# Patient Record
Sex: Female | Born: 1935 | Race: White | Smoking: Current every day smoker
Health system: Southern US, Community
[De-identification: ages and names within clinical notes are randomized; demographics above are authoritative.]

## PROBLEM LIST (undated history)

## (undated) DIAGNOSIS — E119 Type 2 diabetes mellitus without complications: Secondary | ICD-10-CM

## (undated) DIAGNOSIS — I10 Essential (primary) hypertension: Secondary | ICD-10-CM

## (undated) DIAGNOSIS — J449 Chronic obstructive pulmonary disease, unspecified: Secondary | ICD-10-CM

---

## 2012-03-06 ENCOUNTER — Ambulatory Visit: Payer: Self-pay | Admitting: General Practice

## 2012-03-06 DIAGNOSIS — I10 Essential (primary) hypertension: Secondary | ICD-10-CM

## 2012-03-06 LAB — BASIC METABOLIC PANEL
Anion Gap: 9 (ref 7–16)
BUN: 17 mg/dL (ref 7–18)
Calcium, Total: 9.2 mg/dL (ref 8.5–10.1)
Creatinine: 0.83 mg/dL (ref 0.60–1.30)
Osmolality: 289 (ref 275–301)
Potassium: 4.3 mmol/L (ref 3.5–5.1)

## 2012-03-06 LAB — URINALYSIS, COMPLETE
Bilirubin,UR: NEGATIVE
Glucose,UR: NEGATIVE mg/dL (ref 0–75)
Leukocyte Esterase: NEGATIVE
Protein: NEGATIVE
RBC,UR: 3 /HPF (ref 0–5)
Specific Gravity: 1.018 (ref 1.003–1.030)
Squamous Epithelial: NONE SEEN
WBC UR: 1 /HPF (ref 0–5)

## 2012-03-06 LAB — CBC
HCT: 41.1 % (ref 35.0–47.0)
HGB: 13.3 g/dL (ref 12.0–16.0)
MCH: 27.8 pg (ref 26.0–34.0)
MCHC: 32.4 g/dL (ref 32.0–36.0)
Platelet: 173 10*3/uL (ref 150–440)
RDW: 13.8 % (ref 11.5–14.5)

## 2012-03-06 LAB — SEDIMENTATION RATE: Erythrocyte Sed Rate: 4 mm/hr (ref 0–30)

## 2012-03-06 LAB — APTT: Activated PTT: 30.3 secs (ref 23.6–35.9)

## 2012-03-06 LAB — MRSA PCR SCREENING

## 2012-03-06 LAB — PROTIME-INR
INR: 0.9
Prothrombin Time: 12 s

## 2012-03-08 LAB — URINE CULTURE

## 2012-03-21 ENCOUNTER — Inpatient Hospital Stay: Payer: Self-pay | Admitting: General Practice

## 2012-03-22 LAB — BASIC METABOLIC PANEL
BUN: 12 mg/dL (ref 7–18)
Chloride: 106 mmol/L (ref 98–107)
Creatinine: 0.96 mg/dL (ref 0.60–1.30)
EGFR (African American): >60
Potassium: 4 mmol/L (ref 3.5–5.1)
Sodium: 140 mmol/L (ref 136–145)

## 2012-03-23 LAB — BASIC METABOLIC PANEL
Anion Gap: 9 (ref 7–16)
BUN: 12 mg/dL (ref 7–18)
Calcium, Total: 8.7 mg/dL (ref 8.5–10.1)
Chloride: 110 mmol/L — ABNORMAL HIGH (ref 98–107)
Co2: 24 mmol/L (ref 21–32)
Creatinine: 0.89 mg/dL (ref 0.60–1.30)
Glucose: 97 mg/dL (ref 65–99)
Potassium: 4.1 mmol/L (ref 3.5–5.1)
Sodium: 143 mmol/L (ref 136–145)

## 2012-03-23 LAB — PLATELET COUNT: Platelet: 124 x10 3/mm 3 — ABNORMAL LOW

## 2012-03-23 LAB — HEMOGLOBIN: HGB: 10.6 g/dL — ABNORMAL LOW

## 2012-03-25 ENCOUNTER — Encounter: Payer: Self-pay | Admitting: Internal Medicine

## 2012-09-28 IMAGING — CR DG KNEE 1-2V*L*
1 series · 2 of 2 positions shown · non-contrast
Comparison: none

REASON FOR EXAM: postop
COMMENTS:   Bedside (portable):Y

PROCEDURE:     DXR - DXR KNEE LEFT AP AND LATERAL  - March 21, 2012 [DATE]
RESULT:     History: Post-op TKR

[Series 1: ap · 0.17mm/px · 2 of 2 slices shown]
[im 1/2]
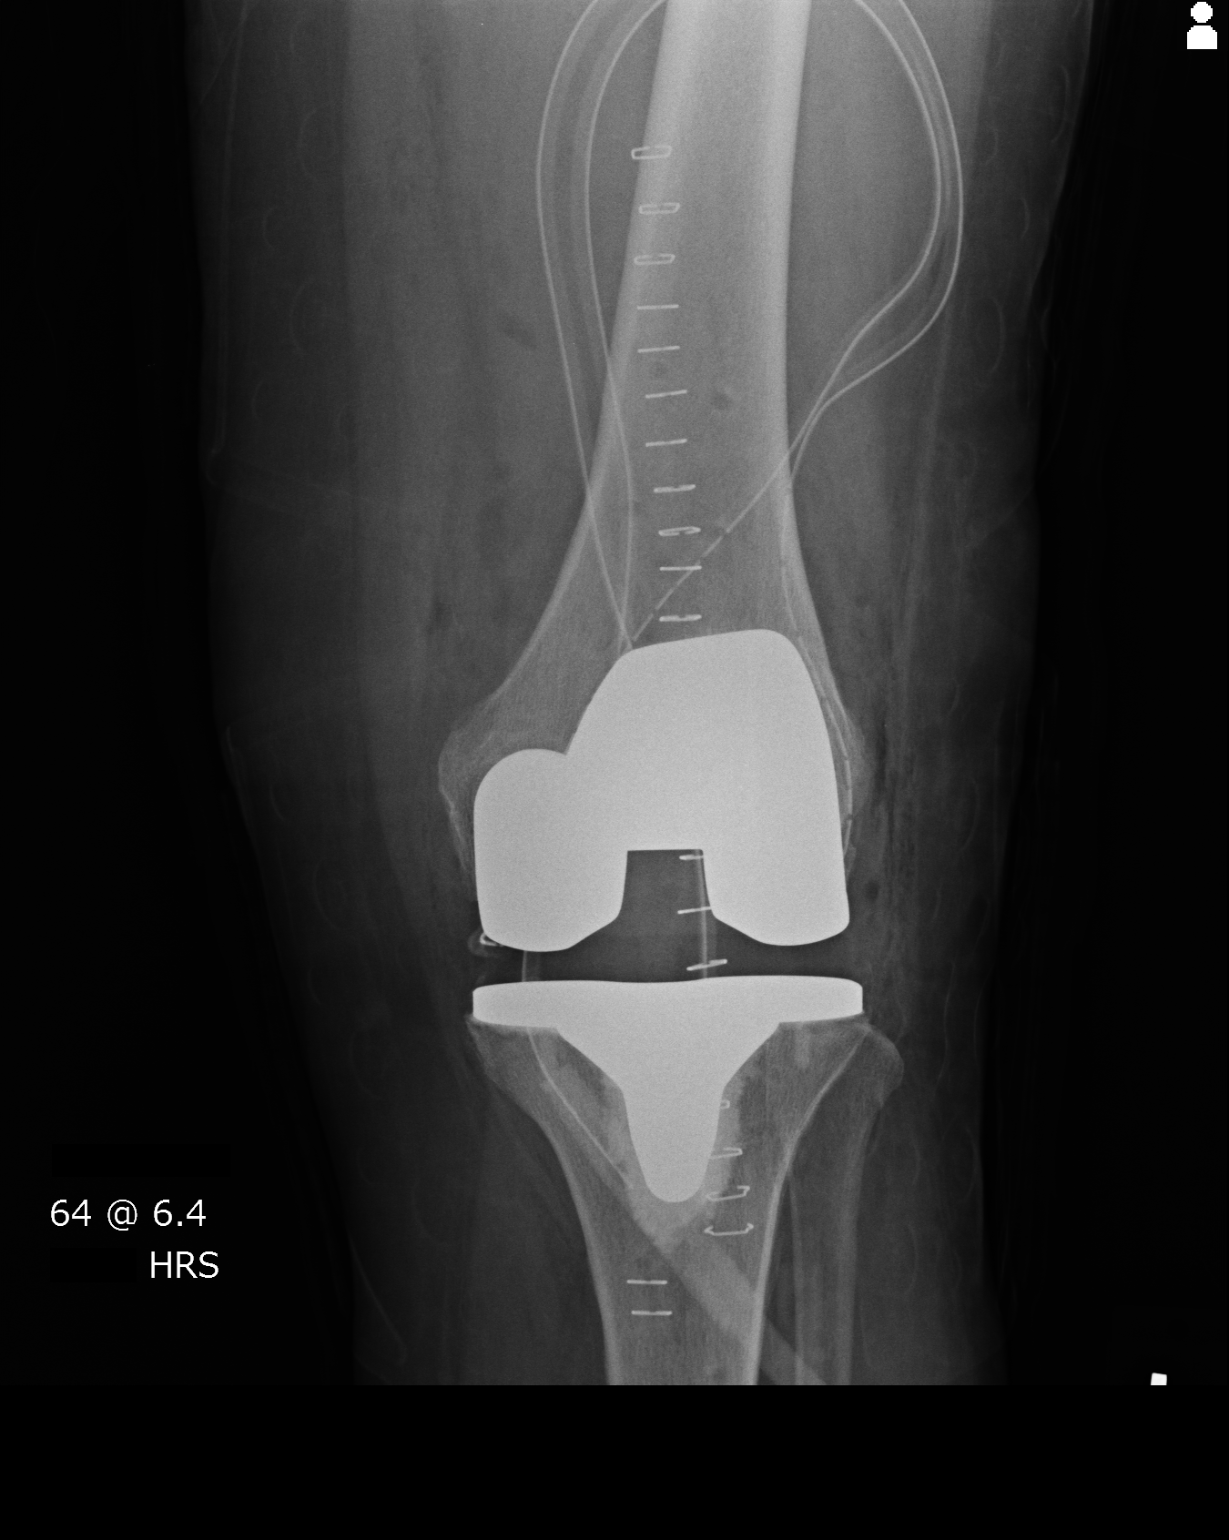
[im 2/2]
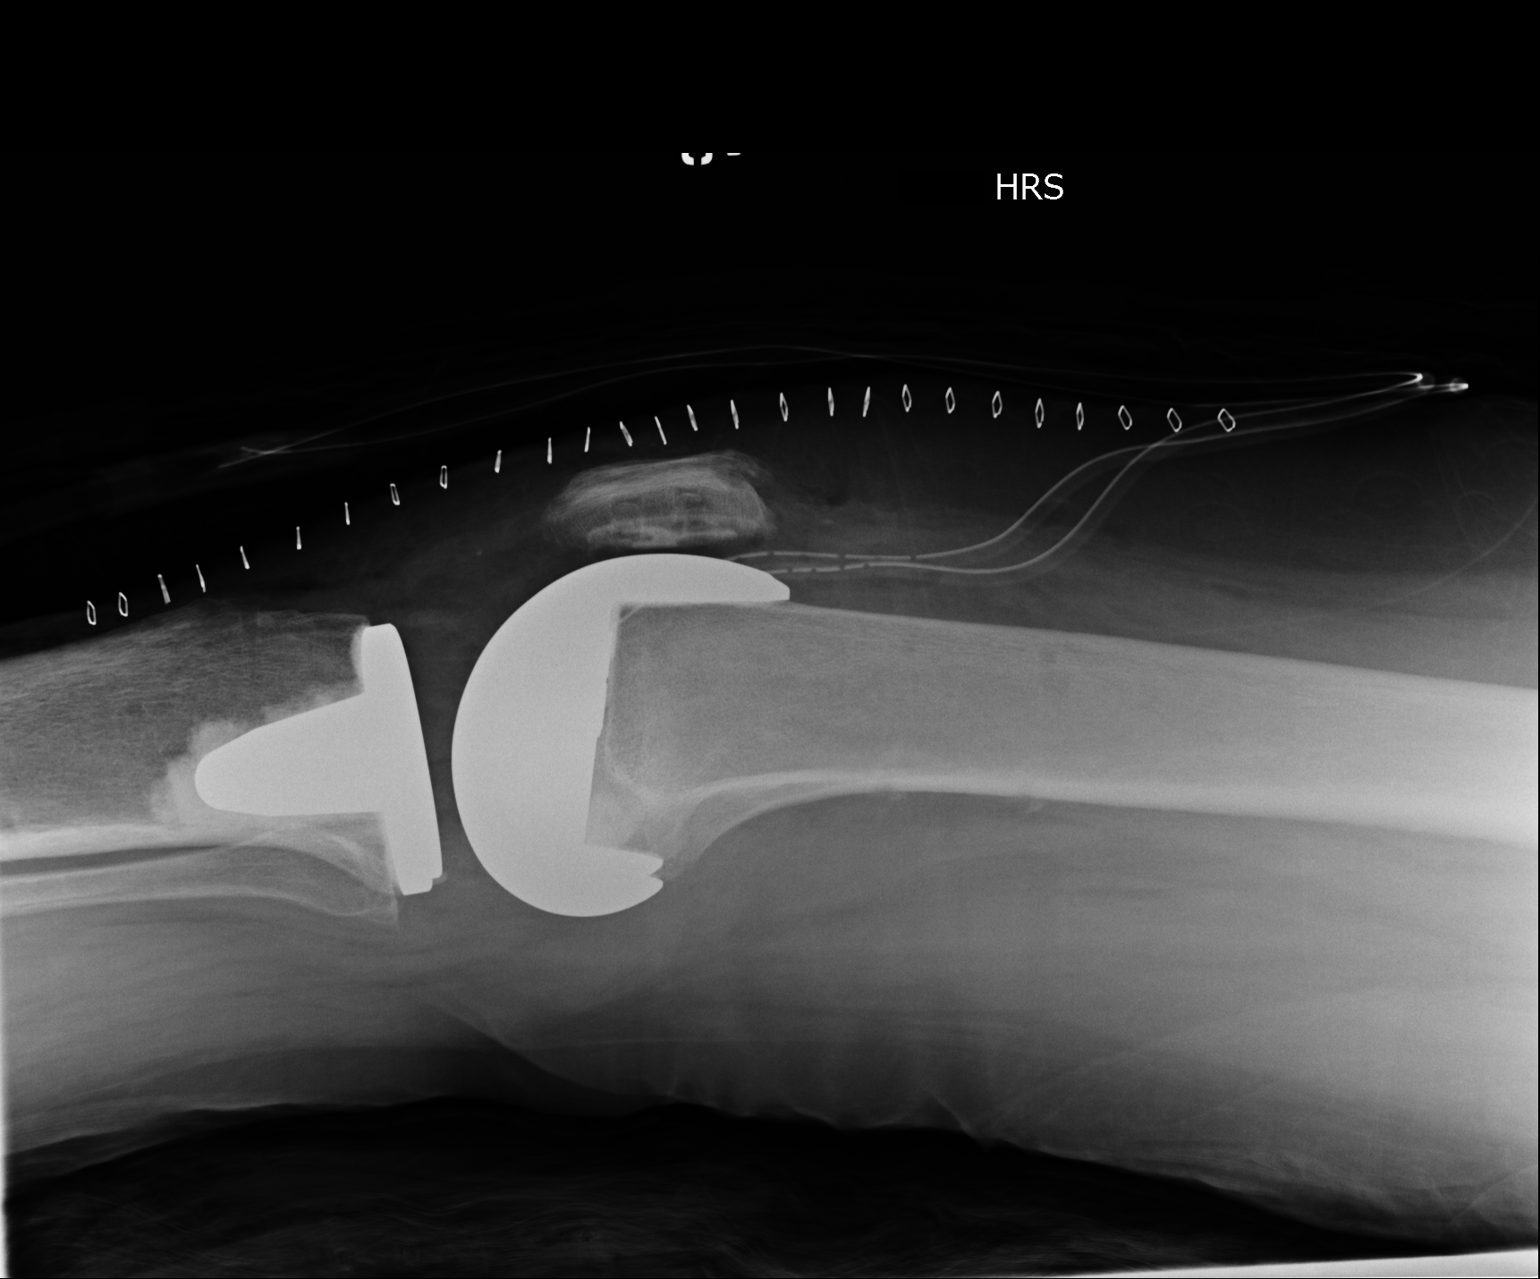

[2 of 2 positions shown; findings below may reference images not displayed]

FINDINGS: AP and lateral views of the left knee demonstrates a total left knee
arthroplasty without evidence of hardware failure complication. There is no
significant joint effusion. There is no fracture or dislocation. The
alignment is anatomic.
IMPRESSION: Left total knee arthroplasty.

[REDACTED]

## 2015-02-08 NOTE — Op Note (Signed)
PATIENT NAME:  Kirsten Petersen, Kirsten Petersen MR#:  [id number] DATE OF BIRTH:  Dec 26, 1922  DATE OF PROCEDURE:  03/21/2012  PREOPERATIVE DIAGNOSIS: Degenerative arthrosis of the left knee.   POSTOPERATIVE DIAGNOSIS: Degenerative arthrosis of the left knee.   PROCEDURE PERFORMED: Left total knee arthroplasty using computer-assisted navigation.   SURGEON: Illene LabradorJames P. Angie FavaHooten Jr., MD  ASSISTANT: Van ClinesJon Wolfe, PA-C (required to maintain retraction throughout the procedure)   ANESTHESIA: Femoral nerve block and spinal.   ESTIMATED BLOOD LOSS: 100 mL.   FLUIDS REPLACED: 1100 mL of crystalloid.   TOURNIQUET TIME: 81 minutes.   DRAINS: Two medium drains to reinfusion system.   SOFT TISSUE RELEASES: Anterior cruciate ligament, posterior cruciate ligament, deep medial collateral ligament, patellofemoral ligament.   IMPLANTS UTILIZED: DePuy PFC Sigma size 3 posterior stabilized femoral component (cemented), size 3 MBT tibial component (cemented), 32 mm three peg oval dome patella (cemented), and a 10 mm stabilized rotating platform polyethylene insert.   INDICATIONS FOR SURGERY: The patient is an 81 year old female who has been seen for complaints of progressive left knee pain. X-rays demonstrated significant degenerative changes in tricompartmental fashion. After discussion of the risks and benefits of surgical intervention, the patient expressed her understanding of the risks and benefits and agreed with plans for surgical intervention.   PROCEDURE IN DETAIL: Patient was brought into the Operating Room and, after adequate femoral nerve block and spinal anesthesia was achieved, a tourniquet was placed on the patient's upper left thigh. Patient's left knee and leg were cleaned and prepped with alcohol and DuraPrep, draped in the usual sterile fashion. A "timeout" was performed as per usual protocol. The left lower extremity was exsanguinated using an Esmarch, and the tourniquet was inflated to 300 mmHg. Anterior  longitudinal incision was made followed by a standard mid vastus approach. A large effusion was evacuated. The deep fibers of the medial collateral ligament were elevated in subperiosteal fashion off the medial flare of the tibia so as to maintain a continuous soft tissue sleeve. Patella was subluxed laterally and the patellofemoral ligament was incised. Inspection of the knee demonstrated severe degenerative changes in tricompartmental fashion. Prominent osteophytes were debrided using rongeur. Anterior and posterior cruciate ligaments were excised. Two 4.0 mm Schanz pins were inserted into the femur and into the tibia for attachment of the array of trackers used for computer-assisted navigation. Hip center was identified using circumduction technique. Distal landmarks were mapped using computer. Distal femur and proximal tibia were mapped using computer. Distal femoral cutting guide was positioned using computer-assisted navigation so as to achieve a 5 degrees distal valgus cut. Cut was performed and verified using the computer. Distal femur was sized and it was felt that a size 3 femoral component was appropriate. Size 3 cutting guide was positioned and the anterior cut was performed and verified using the computer. This was followed by completion of the posterior and chamfer cuts. Femoral cutting guide for the central box was then positioned and central box cut was performed.   Attention was then directed to the proximal tibia. Medial and lateral menisci were excised. The extramedullary tibial cutting guide was positioned using computer-assisted navigation so as to achieve 0 degrees varus valgus alignment and 0 degrees posterior slope. Cut was performed and verified using the computer. Proximal tibia was sized and it was felt that a size 3 tibial tray was appropriate. Tibial and femoral trials were positioned and 10 mm polyethylene trial was inserted. Good medial and lateral soft tissue balancing was noted  both in full  extension and in 90 degrees of flexion. Finally, patella was cut and prepared so as to accommodate a 32 mm three peg oval dome patella. Patellar trial was placed and the knee was placed through a range of motion with excellent patellar tracking appreciated.   Femoral trial was removed. Central post hole for the tibial component was reamed followed by insertion of a keel punch. Tibial trial was then removed. Cut surfaces of bone were irrigated with copious amounts of normal saline with antibiotic solution using pulsatile lavage and then suctioned dry. Polymethyl methacrylate cement was prepared in the usual fashion using a vacuum mixer. Cement was applied to the cut surface of the proximal tibia as well as along the undersurface of a size 3 MBT tibial component. Tibial tunnel was positioned and impacted into place. Excess cement was removed using freer elevators. Cement was then applied to the cut surface of the femur as well as along the posterior flanges of a size 3 posterior stabilized femoral component. Femoral component was positioned and impacted into place. Excess cement was removed using freer elevators. A 10 mm polyethylene trial was inserted and the knee was brought into full extension with steady axial compression applied. Finally, cement was applied to the backside of a 32 mm three peg oval dome patella and patellar component was positioned and patellar clamp applied. Excess cement was removed using freer elevators.   After adequate curing of cement, tourniquet was deflated after total tourniquet time of 81 minutes. Hemostasis was achieved using electrocautery. The knee was irrigated with copious amounts of normal saline with antibiotic solution using pulsatile lavage and then suctioned dry. Knee was inspected for any residual cement debris. 30 mL of 0.25% Marcaine with epinephrine and 4 mg of morphine was injected along the posterior capsule. A 10 mm stabilized rotating platform  polyethylene insert was inserted and the knee was placed through a range of motion. Excellent mediolateral soft tissue balancing was appreciated both in full extension and in 90 degrees of flexion. Excellent patellar tracking was appreciated.   Two medium drains were placed in the wound bed and brought out through a separate stab incision to be attached to reinfusion system. The medial parapatellar portion of the incision was reapproximated using interrupted sutures of #1 Vicryl. Subcutaneous tissue was approximated in layers using first #0 Vicryl followed 2-0 Vicryl. Skin was closed with skin staples. A sterile dressing was applied.   Patient tolerated procedure well. She was transported to the recovery room in stable condition.   ____________________________ Illene Labrador. Angie Fava., MD jph:cms D: 03/21/2012 22:51:30 ET T: 03/22/2012 09:26:52 ET JOB#: [id number]  cc: Illene Labrador. Angie Fava., MD, <Dictator>  Illene Labrador Angie Fava MD ELECTRONICALLY SIGNED 03/22/2012 19:35

## 2015-02-08 NOTE — Discharge Summary (Signed)
PATIENT NAME:  Claybon JabsOOLE, Janyra MR#:  [id number] DATE OF BIRTH:  Sep 19, 1923  DATE OF ADMISSION:  03/21/2012 DATE OF DISCHARGE:  03/24/2012  ADMITTING DIAGNOSIS: Degenerative arthrosis of the left knee.   DISCHARGE DIAGNOSIS: Degenerative arthrosis of the left knee.   HISTORY: The patient is an 81 year old pleasant female who [facility] of [facility] knee pain. She reported almost a one-year history of progressive left knee pain. She had seen some swelling as well as some night pain with the knee. Her pain was noted be aggravated with weight-bearing activities and significantly affected her with stair ambulation. At the time of surgery, she was using a cane for ambulation. She states that the pain had increased in intensity to the point that it was relatively constant. She had not seen any improvement in her condition despite the use of oral and topical antiinflammatories as well as intraarticular cortisone injections as well as a series of Synvisc injections. The pain had progressed to the point that it was significantly interfering with her activities of daily living. [facility] showed narrowing of the cartilage space with subchondral sclerosis as well as osteophyte formation. After discussion of the risks and benefits of surgical intervention, the patient expressed her understanding of the risks and benefits and agreed with plans for surgical intervention.   PROCEDURE: Left total knee arthroplasty using computer-assisted navigation.   ANESTHESIA: Femoral nerve block with spinal.   SOFT TISSUE RELEASE: Anterior cruciate ligament, posterior cruciate ligament, deep medial collateral ligament, as well as the patellofemoral ligament.   IMPLANTS UTILIZED: DePuy PFC Sigma size 3 posterior stabilized femoral component (cemented), size 3 MBT tibial component (cemented), 32 mm three pegged oval dome patella (cemented), and a 10 mm stabilized  rotating platform polyethylene insert.   HOSPITAL COURSE: The patient tolerated the procedure very well. She had no complications. She was then taken to the PAC-U where she was stabilized and then transferred to the orthopedic floor. The patient began receiving anticoagulation therapy of Lovenox 30 mg subcutaneous every 12 hours per anesthesia and pharmacy protocol. She was fitted with TED stockings bilaterally. These were allowed to be removed one hour per eight hour shift. The left one was applied on day two following removal of the Hemovac and dressing change. She was also fitted with the AV-I compression foot pumps set at 80 mmHg. Her calves have been nontender. There has been no evidence of any deep venous thromboses. Negative Homans sign. Heels were elevated off the bed using rolled towels. She has voiced no complaints.   The patient has denied any chest pain or shortness of breath. Vital signs have been stable. She has been afebrile. Hemodynamically she was stable and no transfusions were given.   Physical therapy was initiated on day one for gait training and transfers. She has done well. Upon being discharged she was ambulating 120 feet. She had full extension with flexion being 79 degrees. Occupational therapy was also initiated on day one for activities of daily living and assistive devices.   The patient's IV, Hemovac, and Foley were all discontinued on day two along with a dressing change. The wound was free of any drainage or signs of infection. Polar Care was reapplied to the surgical leg maintaining a temperature of 40 to 50 degrees Fahrenheit.   DISPOSITION: The patient is being discharged to a skilled nursing facility in improved stable condition.   DISCHARGE INSTRUCTIONS:  1. She may weight bear as tolerated.  Continue with physical therapy for gait training and transfers, occupational therapy for activities of daily living and assistive devices.  2. TED stockings are to be worn  around-the-clock. These are allowed to be removed one hour per eight hour shift.  3. Incentive spirometer every 1 hour while awake. Encourage cough and deep breathing every two hours while awake.  4. Polar Care to the surgical leg maintaining a temperature of 40 to 50 degrees Fahrenheit.  5. She is placed on a regular diet.  6. She has a follow-up appointment with Van Clines on 04/05/2012 at 9:45 and Dr. Ernest Pine on 05/03/2012. She is to call the clinic sooner if any temperatures of 101.5 or greater or excessive bleeding.  7. Elevate heels off the bed.   DRUG ALLERGIES: No known drug allergies.    MEDICATIONS:  1. Tylenol ES 500 to 1000 mg every 4 to 6 hours p.r.n. for pain.  2. Celebrex 200 mg b.i.d. 3. Roxicodone 5 to 10 mg every four hours p.r.n. for pain. 4. Tramadol 50 to 100 mg every four hours p.r.n. for pain.  5. Dulcolax suppositories 10 mg rectally daily p.r.n. for constipation. 6. Milk of Magnesia 30 mL b.i.d. p.r.n.  7. Enema soapsuds if no results with milk of magnesia or Dulcolax.  8. Mylanta DS 30 mL every six hours p.r.n.  9. Pantoprazole 40 mg b.i.d.  10. Senokot-S 1 tablet b.i.d. 11. Vasotec 10 mg daily.  12. Omega-3 fatty acid 1 gram capsule daily.  13. Lovenox 30 mg subcutaneous every 12 hours for 14 days then discontinue and begin taking one 81 mg enteric-coated aspirin.   PAST MEDICAL HISTORY:  1. Hepatitis. 2. Breast lumps.  3. Arthritis.  4. Diabetes. 5. Hypertension.  ____________________________ Van Clines, PA jrw:slb D: 03/24/2012 08:55:57 ET T: 03/24/2012 09:39:27 ET JOB#: [id number]  cc: Van Clines, PA, <Dictator> JON WOLFE PA ELECTRONICALLY SIGNED 03/24/2012 21:34

## 2017-06-05 ENCOUNTER — Emergency Department
Admission: EM | Admit: 2017-06-05 | Discharge: 2017-06-05 | Disposition: A | Discharge status: Home / Self Care | Payer: Medicare Other | Attending: Emergency Medicine | Admitting: Emergency Medicine

## 2017-06-05 ENCOUNTER — Emergency Department: Payer: Medicare Other

## 2017-06-05 DIAGNOSIS — J449 Chronic obstructive pulmonary disease, unspecified: Secondary | ICD-10-CM | POA: Diagnosis not present

## 2017-06-05 DIAGNOSIS — R42 Dizziness and giddiness: Secondary | ICD-10-CM | POA: Diagnosis present

## 2017-06-05 DIAGNOSIS — R531 Weakness: Secondary | ICD-10-CM

## 2017-06-05 DIAGNOSIS — I10 Essential (primary) hypertension: Secondary | ICD-10-CM | POA: Diagnosis not present

## 2017-06-05 DIAGNOSIS — E119 Type 2 diabetes mellitus without complications: Secondary | ICD-10-CM | POA: Insufficient documentation

## 2017-06-05 HISTORY — DX: Type 2 diabetes mellitus without complications: E11.9

## 2017-06-05 HISTORY — DX: Essential (primary) hypertension: I10

## 2017-06-05 HISTORY — DX: Chronic obstructive pulmonary disease, unspecified: J44.9

## 2017-06-05 LAB — URINALYSIS, COMPLETE (UACMP) WITH MICROSCOPIC
BILIRUBIN URINE: NEGATIVE
GLUCOSE, UA: NEGATIVE mg/dL
Ketones, ur: NEGATIVE mg/dL
Leukocytes, UA: NEGATIVE
NITRITE: POSITIVE — AB
Protein, ur: NEGATIVE mg/dL
SPECIFIC GRAVITY, URINE: 1.015 (ref 1.005–1.030)
pH: 5 (ref 5.0–8.0)

## 2017-06-05 LAB — CBC
HEMATOCRIT: 40.8 % (ref 35.0–47.0)
Hemoglobin: 13.4 g/dL (ref 12.0–16.0)
MCH: 27.5 pg (ref 26.0–34.0)
MCHC: 32.9 g/dL (ref 32.0–36.0)
MCV: 83.7 fL (ref 80.0–100.0)
PLATELETS: 175 10*3/uL (ref 150–440)
RBC: 4.87 MIL/uL (ref 3.80–5.20)
RDW: 13.5 % (ref 11.5–14.5)
WBC: 6.7 10*3/uL (ref 3.6–11.0)

## 2017-06-05 LAB — BASIC METABOLIC PANEL
Anion gap: 6 (ref 5–15)
BUN: 23 mg/dL — AB (ref 6–20)
CHLORIDE: 108 mmol/L (ref 101–111)
CO2: 28 mmol/L (ref 22–32)
Calcium: 10.2 mg/dL (ref 8.9–10.3)
Creatinine, Ser: 1.06 mg/dL — ABNORMAL HIGH (ref 0.44–1.00)
GFR, EST AFRICAN AMERICAN: 51 mL/min — AB (ref >60)
GFR, EST NON AFRICAN AMERICAN: 44 mL/min — AB (ref >60)
Glucose, Bld: 89 mg/dL (ref 65–99)
POTASSIUM: 4.8 mmol/L (ref 3.5–5.1)
SODIUM: 142 mmol/L (ref 135–145)

## 2017-06-05 LAB — TROPONIN I
Troponin I: <0.03 ng/mL (ref <0.03)
Troponin I: <0.03 ng/mL (ref <0.03)

## 2017-06-05 NOTE — ED Triage Notes (Signed)
FIRST NRUSE NOTE-pt c/o chest pain for 3 weeks. Pulled for ekg

## 2017-06-05 NOTE — ED Provider Notes (Signed)
[facility] Emergency Department Provider Note       Time seen: ----------------------------------------- 12:52 PM on 06/05/2017 -----------------------------------------     I have reviewed the triage vital signs and the nursing notes.   HISTORY   Chief Complaint Chest Pain    HPI Kirsten Petersen is a 83 y.o. female who presents to the ED for not feeling well. Patient states she has dizziness when the filling occurs. She is not really described chest pain but states her chest does not feel right. She denies chest pain shortness breath at this time. She has had intermittent feelings like this for the past 3 weeks. She went to [facility] today and was sent here for further evaluation.   Past Medical History:  Diagnosis Date  . COPD (chronic obstructive pulmonary disease) (HCC)   . Diabetes mellitus without complication (HCC)   . Hypertension     There are no active problems to display for this patient.   History reviewed. No pertinent surgical history.  Allergies Patient has no known allergies.  Social History Social History  Substance Use Topics  . Smoking status: Current Every Day Smoker  . Smokeless tobacco: Not on file  . Alcohol use No    Review of Systems Constitutional: Negative for fever. Eyes: Negative for vision changes ENT:  Negative for congestion, sore throat Cardiovascular: Negative for chest pain. Respiratory: Negative for shortness of breath. Gastrointestinal: Negative for abdominal pain, vomiting and diarrhea. Genitourinary: Negative for dysuria. Musculoskeletal: Negative for back pain. Skin: Negative for rash. Neurological: Negative for headaches,Positive for generalized weakness  All systems negative/normal/unremarkable except as stated in the HPI  ____________________________________________   PHYSICAL EXAM:  VITAL SIGNS: ED Triage Vitals [06/05/17 1002]  Enc Vitals Group     BP (!) 155/69     Pulse  Rate 69     Resp 18     Temp 98.1 F (36.7 C)     Temp Source Oral     SpO2 97 %     Weight 160 lb (72.6 kg)     Height 5\' 2"  (1.575 m)     Head Circumference      Peak Flow      Pain Score      Pain Loc      Pain Edu?      Excl. in GC?     Constitutional: Alert and oriented. Well appearing and in no distress. Eyes: Conjunctivae are normal. Normal extraocular movements. ENT   Head: Normocephalic and atraumatic.   Nose: No congestion/rhinnorhea.   Mouth/Throat: Mucous membranes are moist.   Neck: No stridor. Cardiovascular: Normal rate, regular rhythm. No murmurs, rubs, or gallops. Respiratory: Normal respiratory effort without tachypnea nor retractions. Breath sounds are clear and equal bilaterally. No wheezes/rales/rhonchi. Gastrointestinal: Soft and nontender. Normal bowel sounds Musculoskeletal: Nontender with normal range of motion in extremities. No lower extremity tenderness nor edema. Neurologic:  Normal speech and language. No gross focal neurologic deficits are appreciated.  Skin:  Skin is warm, dry and intact. No rash noted. Psychiatric: Mood and affect are normal. Speech and behavior are normal.  ____________________________________________  EKG: Interpreted by me. Sinus rhythm rate of 69 bpm, normal PR interval, normal QRS, normal QT, normal axis.  ____________________________________________  ED COURSE:  Pertinent labs & imaging results that were available during my care of the patient were reviewed by me and considered in my medical decision making (see chart for details). Patient presents for weakness, we will assess with labs and  imaging as indicated.   Procedures ____________________________________________   LABS (pertinent positives/negatives)  Labs Reviewed  BASIC METABOLIC PANEL - Abnormal; Notable for the following:       Result Value   BUN 23 (*)    Creatinine, Ser 1.06 (*)    GFR calc non Af Amer 44 (*)    GFR calc Af Amer 51 (*)     All other components within normal limits  URINALYSIS, COMPLETE (UACMP) WITH MICROSCOPIC - Abnormal; Notable for the following:    Color, Urine YELLOW (*)    APPearance CLEAR (*)    Hgb urine dipstick SMALL (*)    Nitrite POSITIVE (*)    Bacteria, UA RARE (*)    Squamous Epithelial / LPF 0-5 (*)    All other components within normal limits  CBC  TROPONIN I  TROPONIN I    RADIOLOGY Images were viewed by me  Chest x-ray is normal  ____________________________________________  FINAL ASSESSMENT AND PLAN  Weakness  Plan: Patient's labs and imaging were dictated above. Patient had presented for Weakness and was never really having chest pain but states she did not feel well. Repeat troponin here is normal, no specific explanation for her symptoms. She stable for outpatient follow-up.   Emily Filbert, MD   Note: This note was generated in part or whole with voice recognition software. Voice recognition is usually quite accurate but there are transcription errors that can and very often do occur. I apologize for any typographical errors that were not detected and corrected.     Emily Filbert, MD 06/05/17 [phone]

## 2017-06-05 NOTE — ED Notes (Signed)
DC inst given to patient

## 2017-06-05 NOTE — ED Triage Notes (Signed)
Pt states that her chest "doesn't feel right". States dizziness when feeling occurs. Denies chest pain or SOB at this time. Pt alert and oriented X4, active, cooperative, pt in NAD. RR even and unlabored, color WNL.

## 2017-12-13 IMAGING — CR DG CHEST 2V
1 series · 2 of 2 positions shown · non-contrast
Comparison: Report of a chest x-ray May 21, 2003

CLINICAL DATA: Recent abnormal sensation within the chest
associated with dizziness. No chest pain or shortness of breath
currently. History of COPD, current smoker, diabetes, and
hypertension.

EXAM:
CHEST  2 VIEW

[Series 1: dg chest 2 view · 0.14mm/px · 2 of 2 slices shown]
[im 1/2]
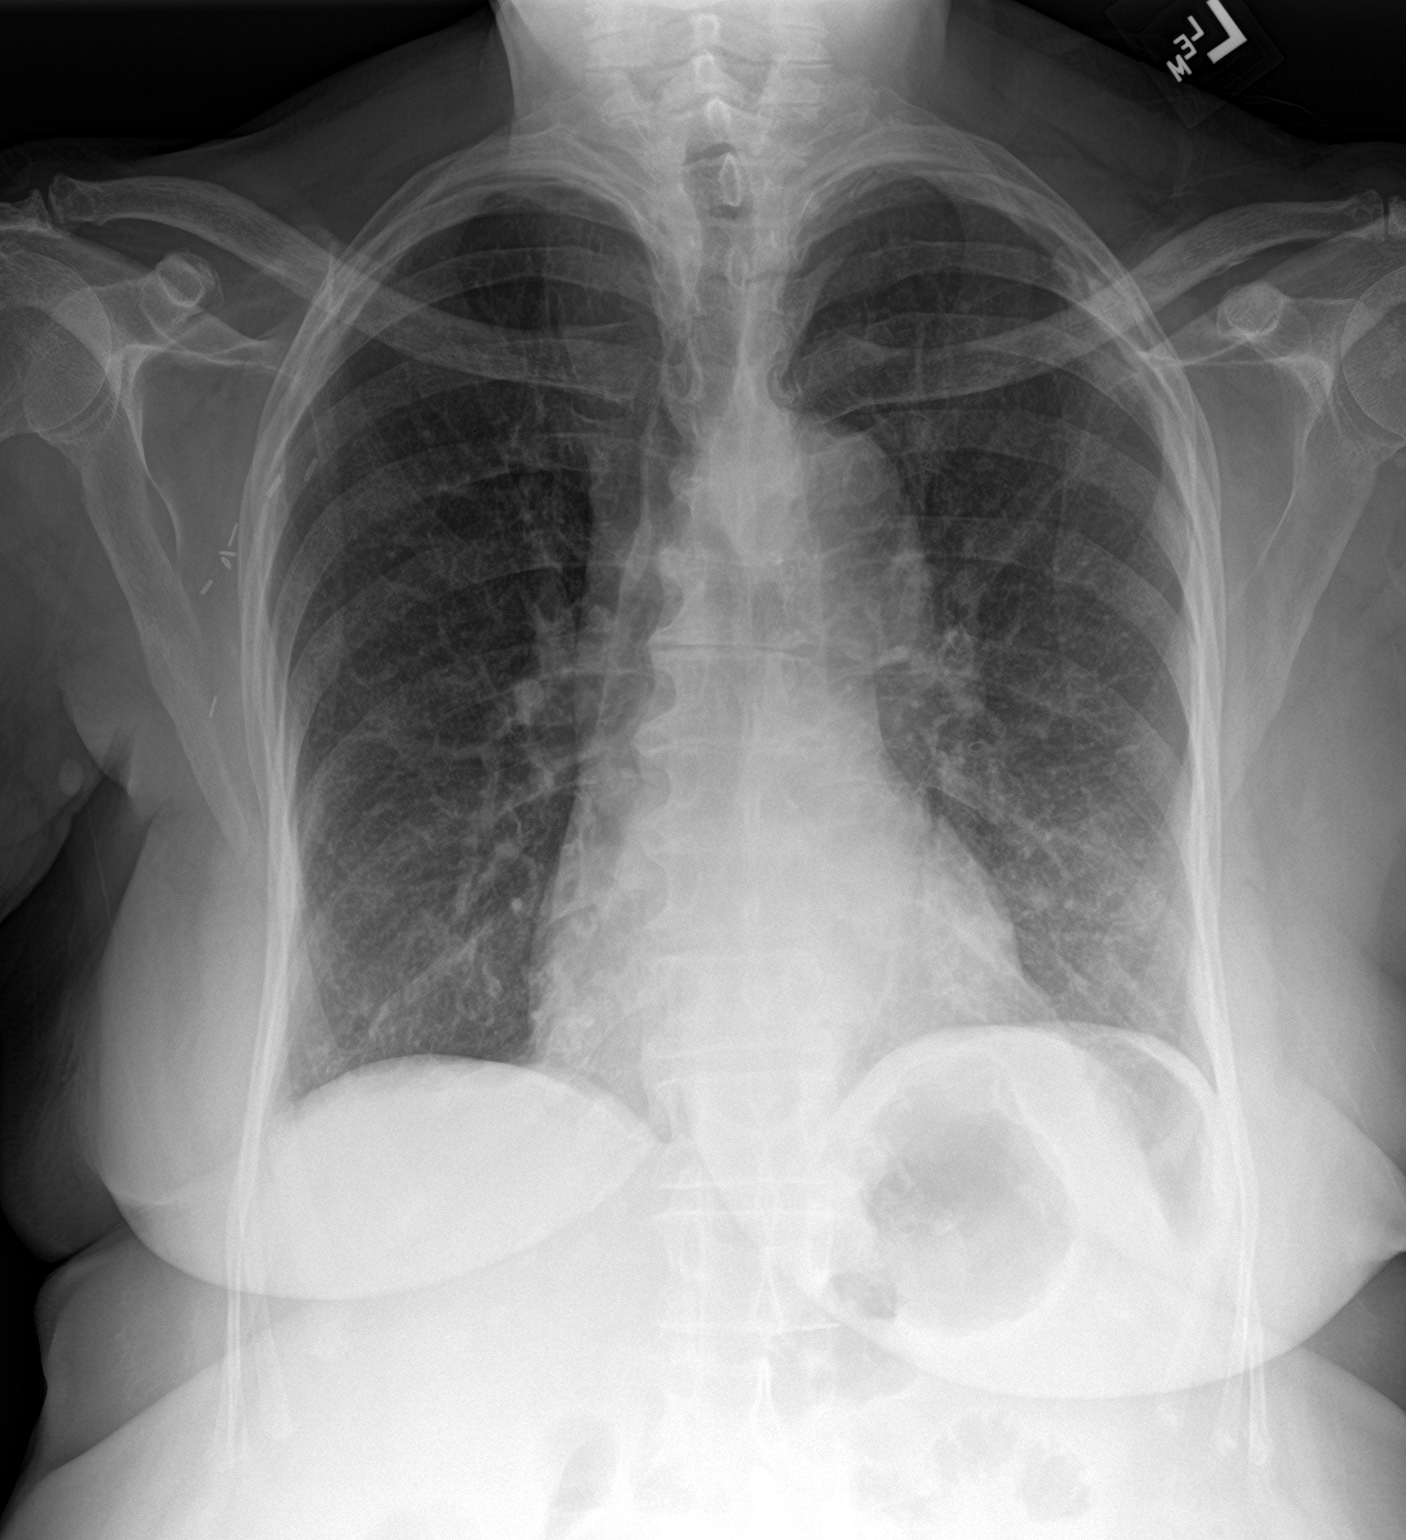
[im 2/2]
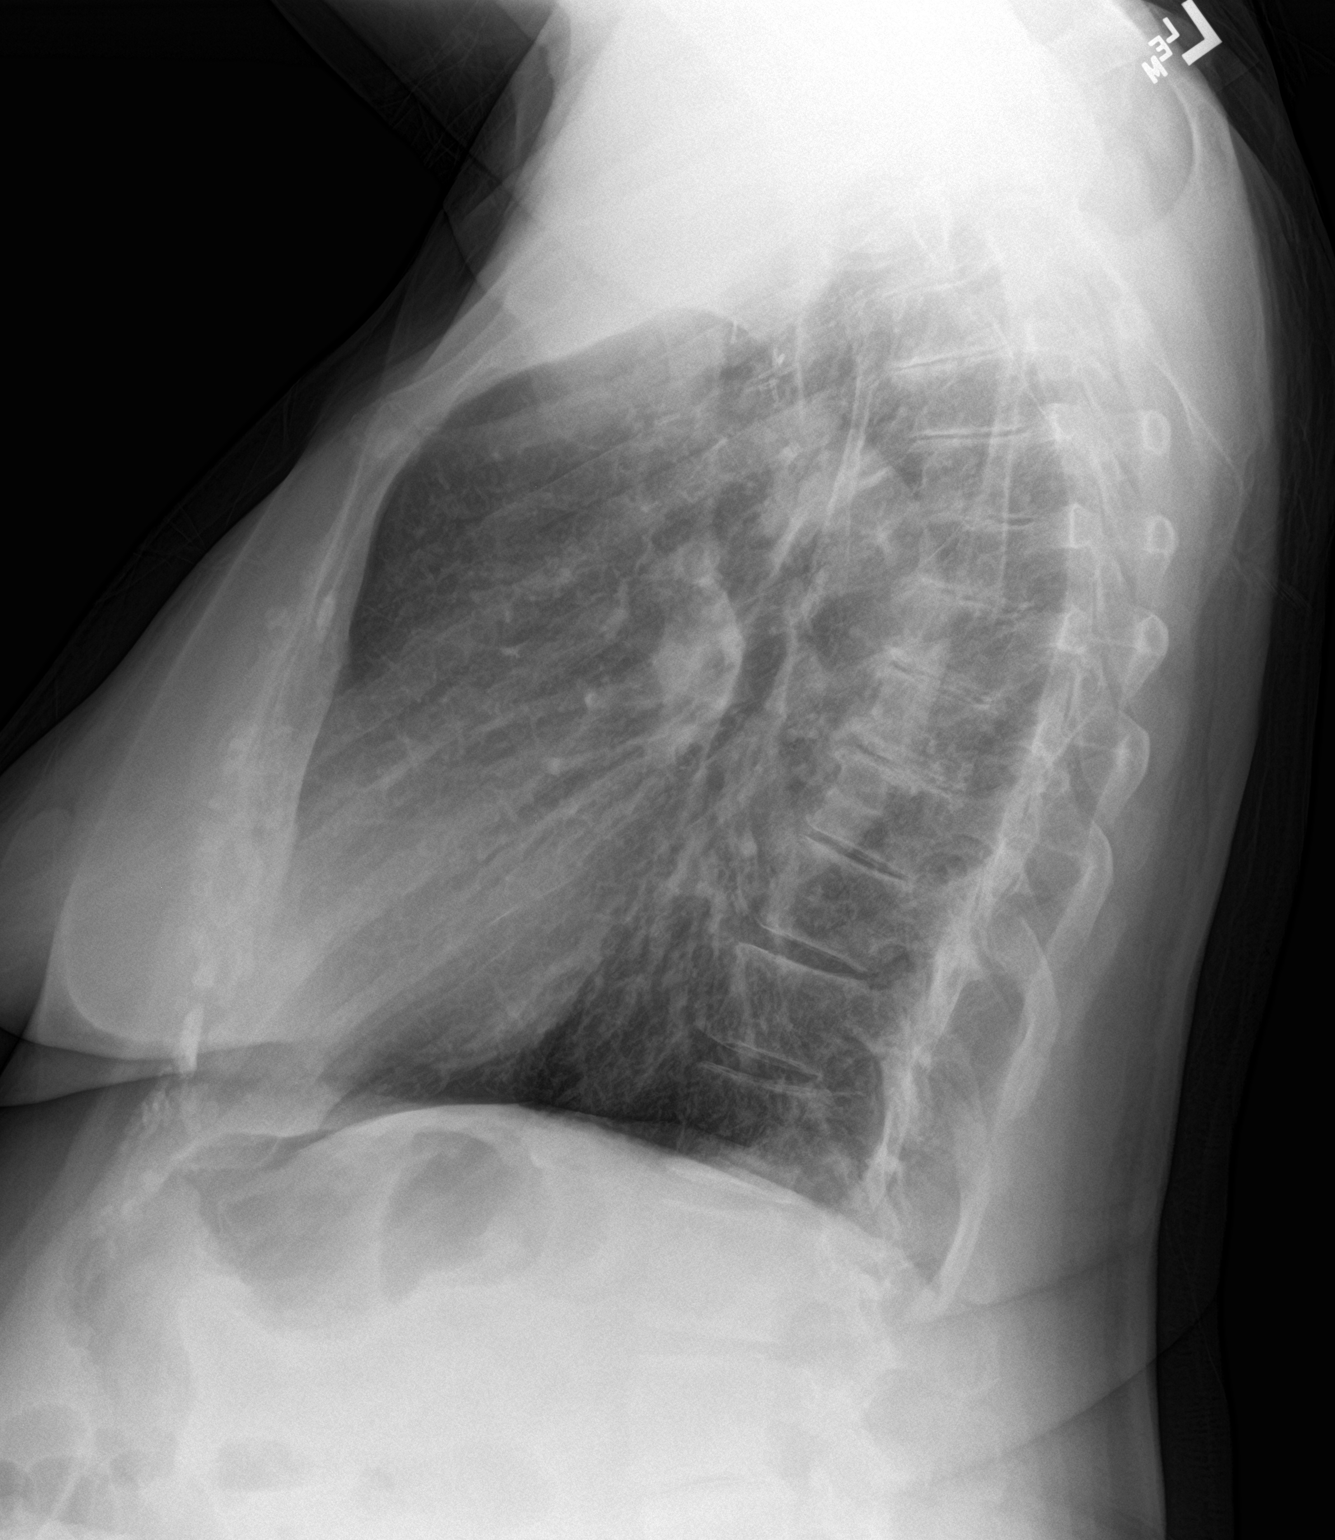

[2 of 2 positions shown; findings below may reference images not displayed]

FINDINGS: The lungs are well-expanded with mild hemidiaphragm flattening. The
interstitial markings are coarse. The heart is normal in size. The
pulmonary vascularity is not engorged. There is calcification in the
wall of the aortic arch and tortuosity of the descending thoracic
aorta. There is no pleural effusion. There is moderate multilevel
degenerative disc space narrowing of the thoracic spine. There are
surgical clips in the right axillary region likely from axillary
lymph node dissection.
IMPRESSION: Chronic bronchitic changes.  No acute pneumonia nor CHF.

Thoracic aortic atherosclerosis.
